# Patient Record
Sex: Male | Born: 1985 | Race: White | Hispanic: No | Marital: Married | Smoking: Never smoker
Health system: Southern US, Community
[De-identification: ages and names within clinical notes are randomized; demographics above are authoritative.]

---
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
## 2017-09-25 ENCOUNTER — Encounter (HOSPITAL_COMMUNITY): Payer: Self-pay | Admitting: Emergency Medicine

## 2017-09-25 ENCOUNTER — Other Ambulatory Visit: Payer: Self-pay

## 2017-09-25 ENCOUNTER — Emergency Department (HOSPITAL_COMMUNITY)
Admission: EM | Admit: 2017-09-25 | Discharge: 2017-09-25 | Disposition: A | Discharge status: Home / Self Care | Payer: Commercial Managed Care - PPO | Attending: Emergency Medicine | Admitting: Emergency Medicine

## 2017-09-25 DIAGNOSIS — M79601 Pain in right arm: Secondary | ICD-10-CM | POA: Diagnosis present

## 2017-09-25 DIAGNOSIS — R2231 Localized swelling, mass and lump, right upper limb: Secondary | ICD-10-CM | POA: Insufficient documentation

## 2017-09-25 DIAGNOSIS — M7989 Other specified soft tissue disorders: Secondary | ICD-10-CM

## 2017-09-25 NOTE — ED Notes (Signed)
Pt verbalized understanding of d/c instructions, vss, ambulatory out of ed upon discharge. nad

## 2017-09-25 NOTE — Discharge Instructions (Signed)
You were seen in the ER for right arm swelling.   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  This is usually due to chronic, repeated microtrauma and compression of the ulnar nerve that runs from your elbow to your ring and pinky fingers.  Occasionally, strenuous activity or sports (ex: golf or baseball) can worsen symptoms.   Initial management includes anti-inflammatories, ice, rest and avoiding repeated trauma.  Take tylenol [phone] mg every 8 hours for the next 3 days.  Additionally, ice your elbow and wrist for 20-30 mins 2 to 3 times a day for the next 3 days.  Avoid repeated trauma or movements of your elbow and wrist. You may purchase an elbow pad to wear during computer time.   Follow up with your primary care doctor in 1 week if symptoms do not improve. Severe neuropathy sometimes need nerve and muscle testing, MRI, surgery.

## 2017-09-25 NOTE — ED Notes (Signed)
PA at bedside.

## 2017-09-25 NOTE — ED Provider Notes (Signed)
[facility] EMERGENCY DEPARTMENT Provider Note   CSN: [id number] Arrival date & time: 09/25/17  0945     History   Chief Complaint Chief Complaint  Patient presents with  . Arm Pain    HPI [name] is a 32 y.o. male here for evaluation of right arm swelling. Noticed today after he was moving some boxes.  Associated with mild pain described as "sorenes" to the ulnar aspect of his wrist and medial elbow.  Due to swelling initially thought it was an allergic reaction, he thought he may have come across or touched insulation so he took benadryl which did not help. He denies any redness, warmth or itching. He is RHD. He sits at and uses a computer for long periods of time at work. No weakness or numbness.   HPI  History reviewed. No pertinent past medical history.  There are no active problems to display for this patient.   History reviewed. No pertinent surgical history.      Home Medications    Prior to Admission medications   Not on File    Family History No family history on file.  Social History Social History   Tobacco Use  . Smoking status: Not on file  Substance Use Topics  . Alcohol use: Not on file  . Drug use: Not on file     Allergies   Patient has no known allergies.   Review of Systems Review of Systems  Musculoskeletal: Positive for gait problem.  All other systems reviewed and are negative.    Physical Exam Updated Vital Signs BP 112/73 (BP Location: Left Arm)   Pulse 86   Temp 98 F (36.7 C) (Oral)   Resp 20   SpO2 98%   Physical Exam  Constitutional: He is oriented to person, place, and time. He appears well-developed and well-nourished.  Non-toxic appearance.  HENT:  Head: Normocephalic.  Right Ear: External ear normal.  Left Ear: External ear normal.  Nose: Nose normal.  Eyes: Conjunctivae and EOM are normal.  Neck: Full passive range of motion without pain.  No midline or paraspinal muscle  tenderness to c-spine. Full AROM of neck without pain.   Cardiovascular: Normal rate.  2+ radial and ulnar pulses bilaterally  Pulmonary/Chest: Effort normal. No tachypnea. No respiratory distress.  Musculoskeletal: Normal range of motion. He exhibits tenderness.  Mild edema to medial aspect of olecranon, medial forearm and ulnar aspect of wrist and dorsal hand. Mild tenderness to medial aspect of olecranon and more significant tenderness along distribution of ulnar nerve towards hand and ulnar aspect of wrist. Full AROM of wrist with no pain. Full thumb opposition to all fingers with good strength. 5/5 hand grip bilaterally. 5/5 strength with finger flexion, extension, abduction and adduction. 5/5 strength with elbow F/E, painless.   Neurological: He is alert and oriented to person, place, and time.  Slight decreased sensation to light touch/pinch to right pinky finger, otherwise sensation to light touch/pinch intact bilaterally.    Skin: Skin is warm and dry. Capillary refill takes less than 2 seconds.  Psychiatric: His behavior is normal. Thought content normal.     ED Treatments / Results  Labs (all labs ordered are listed, but only abnormal results are displayed) Labs Reviewed - No data to display  EKG None  Radiology No results found.  Procedures Procedures (including critical care time)  Medications Ordered in ED Medications - No data to display   Initial Impression / Assessment and Plan /  ED Course  I have reviewed the triage vital signs and the nursing notes.  Pertinent labs & imaging results that were available during my care of the patient were reviewed by me and considered in my medical decision making (see chart for details).    ddx includes ulnar neuropathy vs other MSK or neuropathy. He has swelling and tenderness along ulnar nerve distribution, more focal tenderness at ulnar aspect of wrist. He does sit and type on his computer for work. 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 There was pt concern about allergic reaction given swelling, however he has no rash, erythema, pruritis and this is less likely.  Other than subjective mild decreased sensation to pinky finger, no signs of persistent or significant sensory loss, muscle wasting, weakness. He has no neck or thoracic midline pain to suggest more proximal lesion or bulging disc. Will discharge with RICE, rest, recommendation to wear elbow pad and modify activities. F/u with PCP for re-evaluation if symptoms persist for possible nerve/muscle testing. Return precautions given.   Final Clinical Impressions(s) / ED Diagnoses   Final diagnoses:  Arm swelling    ED Discharge Orders    None       Liberty HandyGibbons, Briget Shaheed J, PA-C 09/25/17 1049    Gwyneth SproutPlunkett, Whitney, MD 09/26/17 2302

## 2017-09-25 NOTE — ED Triage Notes (Signed)
Pt reports he was moving some boxes this am when he started to have pain and swelling to right forearm. Denies numbness or tingling, red and warm to the touch

## 2017-09-25 NOTE — ED Notes (Signed)
Ice pack placed to forearm

## 2017-11-16 ENCOUNTER — Other Ambulatory Visit: Payer: Self-pay | Admitting: Nurse Practitioner

## 2017-11-16 DIAGNOSIS — R1031 Right lower quadrant pain: Secondary | ICD-10-CM

## 2017-11-22 ENCOUNTER — Other Ambulatory Visit: Payer: Commercial Managed Care - PPO

## 2017-11-28 ENCOUNTER — Ambulatory Visit
Admission: RE | Admit: 2017-11-28 | Discharge: 2017-11-28 | Disposition: A | Discharge status: Home / Self Care | Payer: Commercial Managed Care - PPO | Source: Ambulatory Visit | Attending: Nurse Practitioner | Admitting: Nurse Practitioner

## 2017-11-28 DIAGNOSIS — R1031 Right lower quadrant pain: Secondary | ICD-10-CM

## 2018-12-12 ENCOUNTER — Other Ambulatory Visit: Payer: Self-pay | Admitting: Emergency Medicine

## 2018-12-12 DIAGNOSIS — Z20822 Contact with and (suspected) exposure to covid-19: Secondary | ICD-10-CM

## 2018-12-13 LAB — NOVEL CORONAVIRUS, NAA: SARS-CoV-2, NAA: DETECTED — AB

## 2018-12-29 ENCOUNTER — Other Ambulatory Visit: Payer: Self-pay | Admitting: *Deleted

## 2018-12-29 DIAGNOSIS — Z20822 Contact with and (suspected) exposure to covid-19: Secondary | ICD-10-CM

## 2018-12-30 LAB — NOVEL CORONAVIRUS, NAA: SARS-CoV-2, NAA: DETECTED — AB

## 2019-03-24 IMAGING — US US PELVIS LIMITED
1 series · 14 of 18 positions shown · non-contrast
Comparison: None.

CLINICAL DATA: Painful palpable area in the right groin

EXAM:
LIMITED ULTRASOUND OF PELVIS
TECHNIQUE: Limited transabdominal ultrasound examination of the pelvis was
performed.

[Series 1: us pelvis limited · 0.08mm/px · 18 acquisitions, 14 frames shown]
[im 1/18]
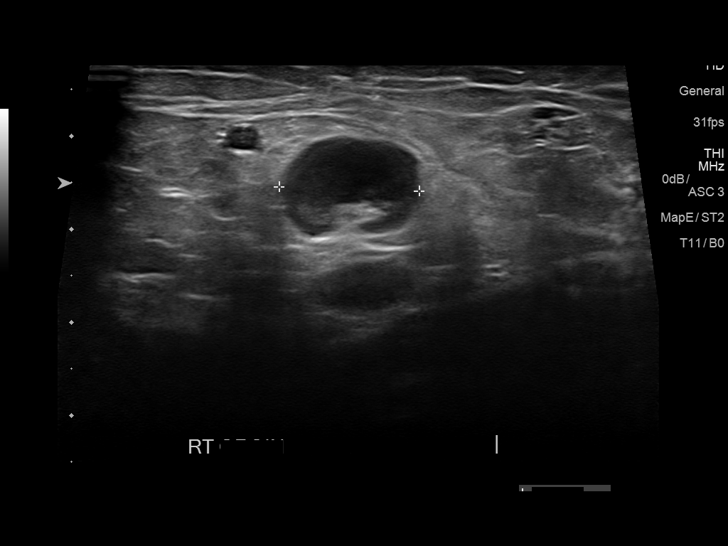
[im 2/18]
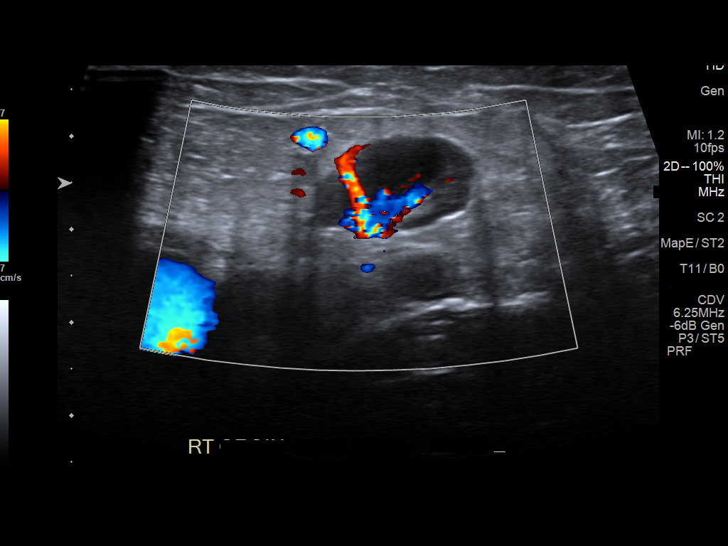
[im 4/18]
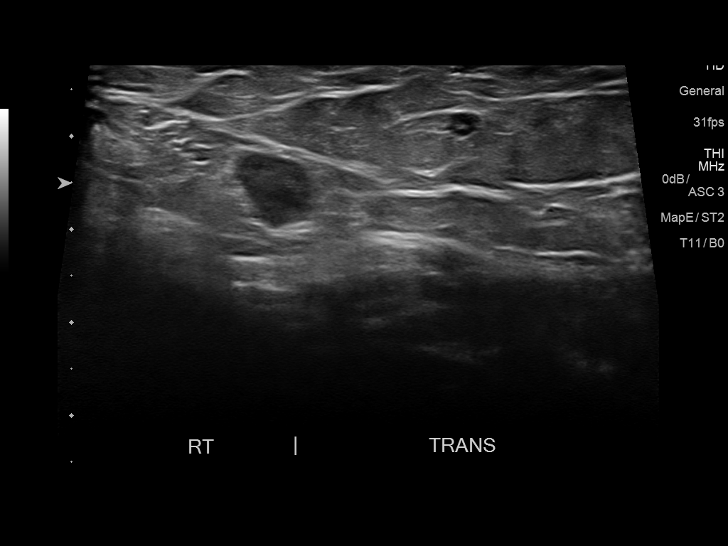
[im 5/18]
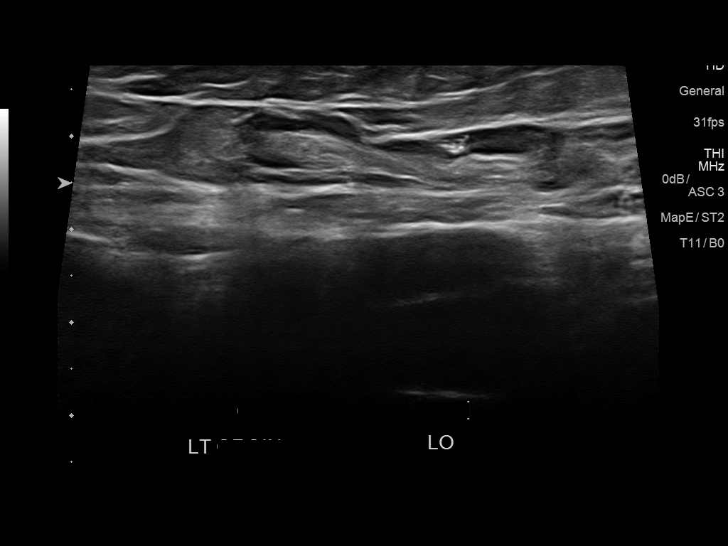
[im 6/18]
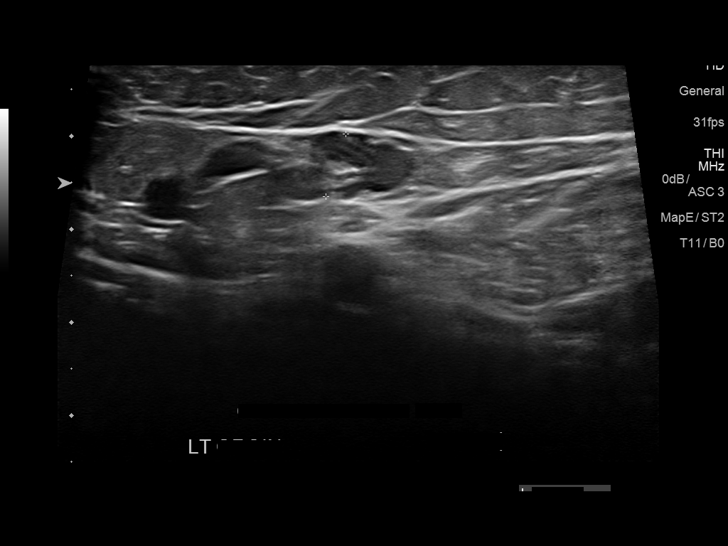
[im 8/18]
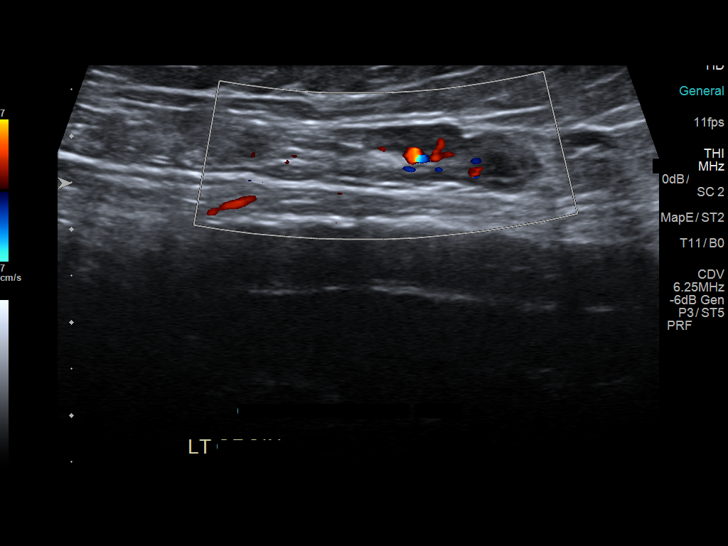
[im 9/18]
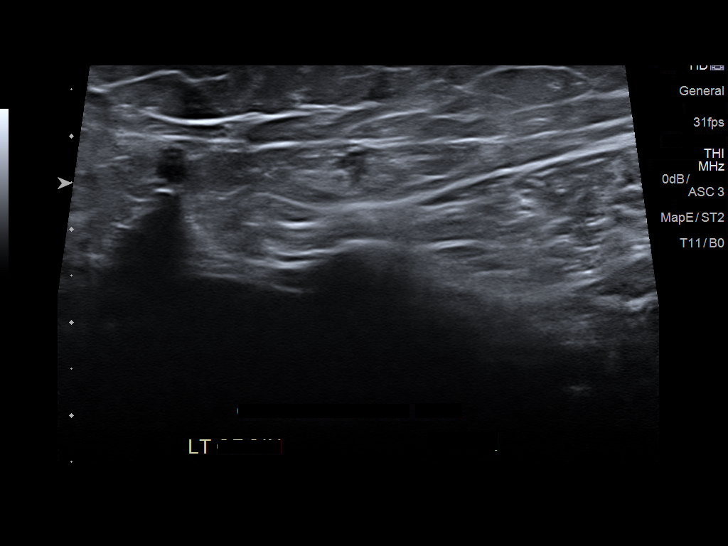
[im 10/18]
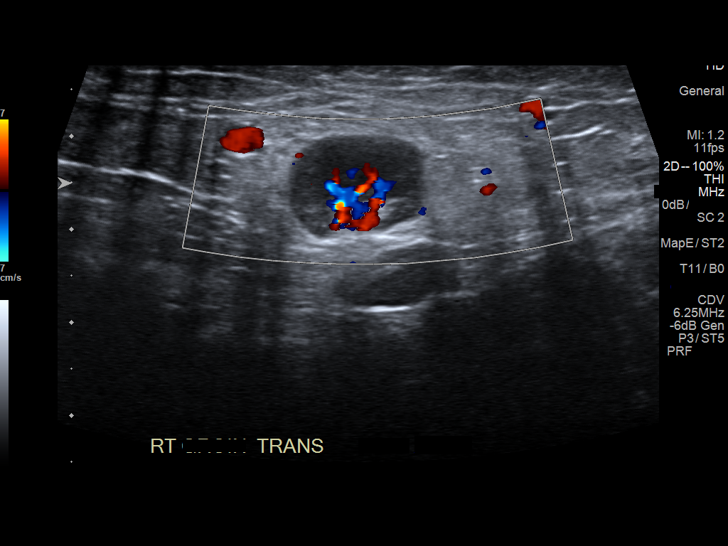
[im 11/18]
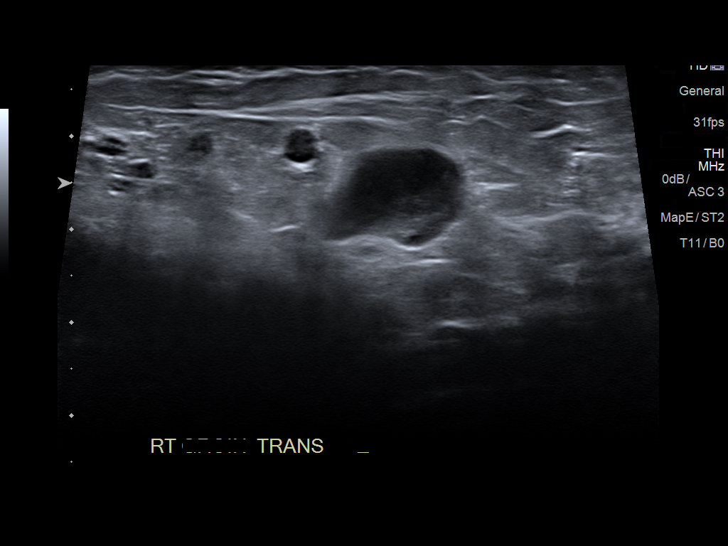
[im 13/18]
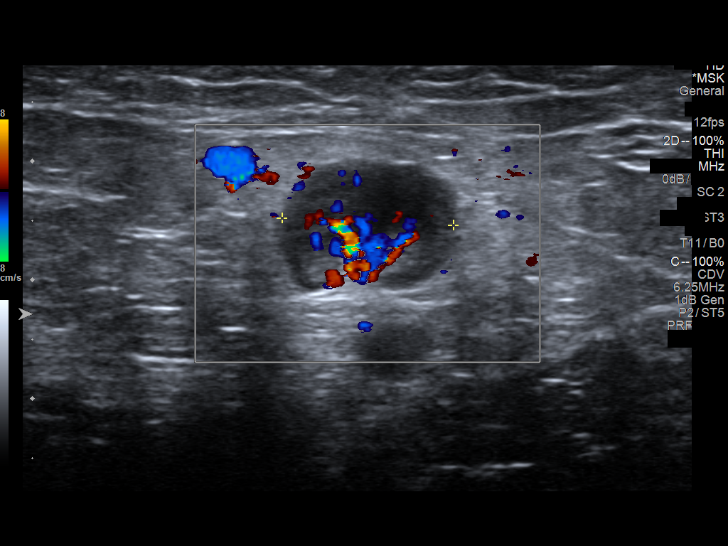
[im 14/18]
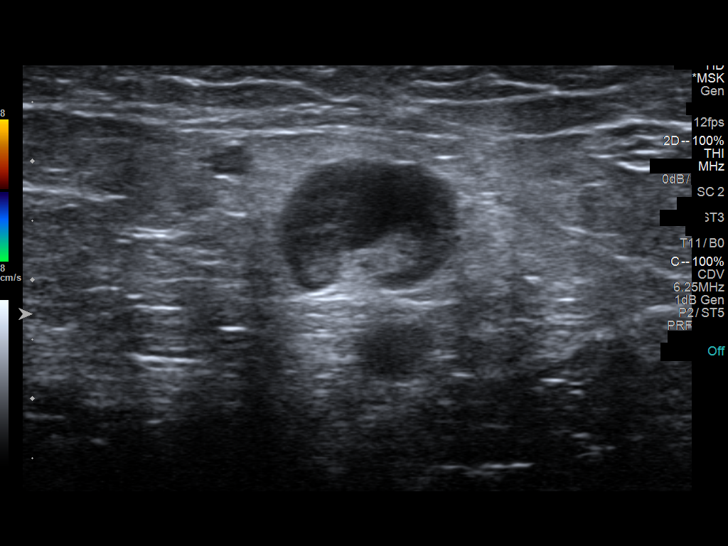
[im 15/18]
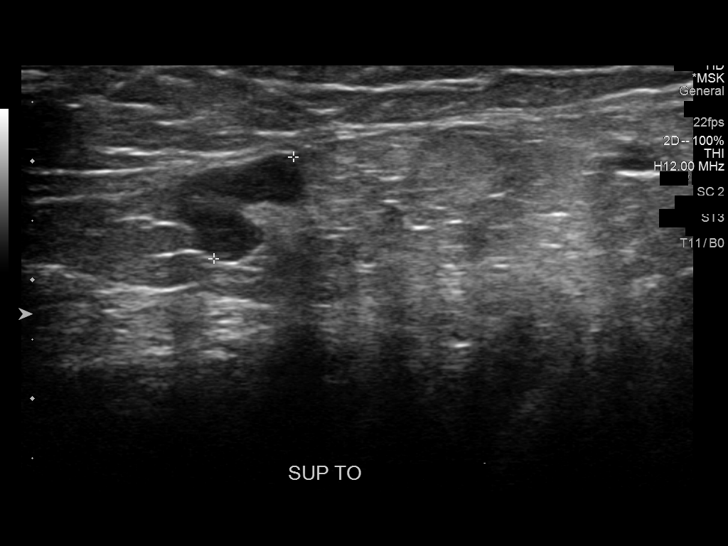
[im 17/18]
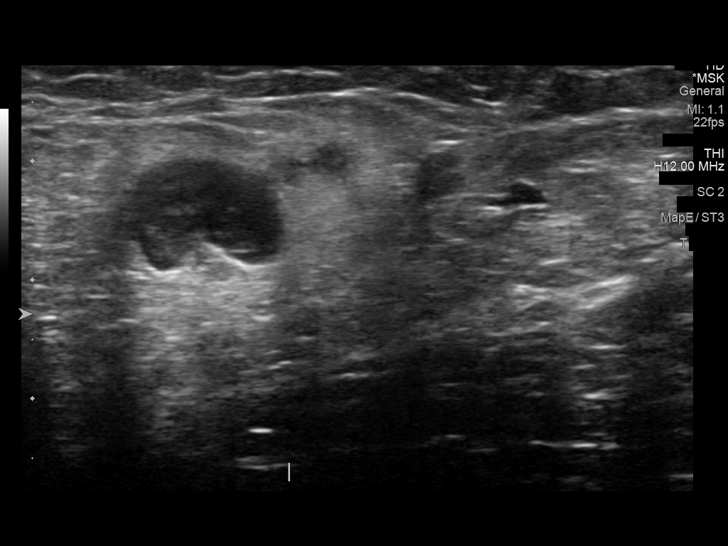
[im 18/18]
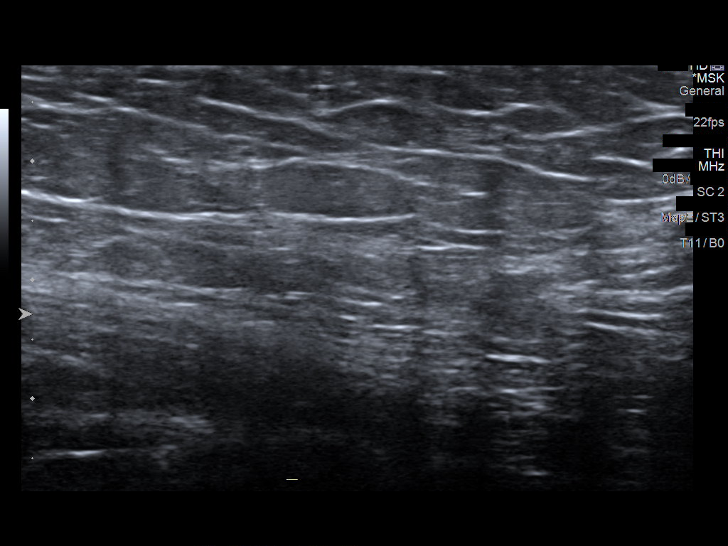

[14 of 18 positions shown; findings below may reference images not displayed]

FINDINGS: Targeted sonography of the right groin is performed in the region of
palpable mass. In the region of palpable abnormality and tenderness
is an enlarged lymph nodes measuring 1.4 cm in maximum diameter.
Cortex is thickened. Echogenic hilus is maintained. There are
additional nonspecific bilateral groin lymph nodes. No focal fluid
collections.
IMPRESSION: Corresponding to the patient's tender nodule in the right groin is
an enlarged lymph node measuring 1.4 cm with thickened cortex.
Further management will need to be based on clinical grounds.

## 2019-12-09 ENCOUNTER — Emergency Department (HOSPITAL_COMMUNITY)
Admission: EM | Admit: 2019-12-09 | Discharge: 2019-12-10 | Disposition: A | Discharge status: Home / Self Care | Payer: 59 | Attending: Emergency Medicine | Admitting: Emergency Medicine

## 2019-12-09 ENCOUNTER — Other Ambulatory Visit: Payer: Self-pay

## 2019-12-09 ENCOUNTER — Encounter (HOSPITAL_COMMUNITY): Payer: Self-pay | Admitting: Emergency Medicine

## 2019-12-09 DIAGNOSIS — R21 Rash and other nonspecific skin eruption: Secondary | ICD-10-CM | POA: Insufficient documentation

## 2019-12-09 DIAGNOSIS — R001 Bradycardia, unspecified: Secondary | ICD-10-CM | POA: Diagnosis not present

## 2019-12-09 DIAGNOSIS — R220 Localized swelling, mass and lump, head: Secondary | ICD-10-CM | POA: Diagnosis not present

## 2019-12-09 DIAGNOSIS — T7840XA Allergy, unspecified, initial encounter: Secondary | ICD-10-CM | POA: Diagnosis not present

## 2019-12-09 DIAGNOSIS — L509 Urticaria, unspecified: Secondary | ICD-10-CM | POA: Diagnosis present

## 2019-12-09 MED ORDER — FAMOTIDINE IN NACL 20-0.9 MG/50ML-% IV SOLN
20.0000 mg | INTRAVENOUS | Status: AC
  Administered 2019-12-09: 20 mg via INTRAVENOUS
  Filled 2019-12-09: qty 50

## 2019-12-09 MED ORDER — ATROPINE SULFATE 1 MG/10ML IJ SOSY
PREFILLED_SYRINGE | INTRAMUSCULAR | Status: AC
  Filled 2019-12-09: qty 10

## 2019-12-09 MED ORDER — EPINEPHRINE 1 MG/10ML IJ SOSY
PREFILLED_SYRINGE | INTRAMUSCULAR | Status: DC
Start: 2019-12-09 — End: 2019-12-10
  Filled 2019-12-09: qty 10

## 2019-12-09 MED ORDER — EPINEPHRINE 0.3 MG/0.3ML IJ SOAJ
INTRAMUSCULAR | Status: AC
  Filled 2019-12-09: qty 0.3

## 2019-12-09 MED ORDER — METHYLPREDNISOLONE SODIUM SUCC 125 MG IJ SOLR
125.0000 mg | Freq: Once | INTRAMUSCULAR | Status: AC
  Administered 2019-12-09: 125 mg via INTRAVENOUS
  Filled 2019-12-09: qty 2

## 2019-12-09 MED ORDER — ONDANSETRON HCL 4 MG/2ML IJ SOLN
4.0000 mg | Freq: Once | INTRAMUSCULAR | Status: AC
  Administered 2019-12-09: 4 mg via INTRAVENOUS
  Filled 2019-12-09: qty 2

## 2019-12-09 NOTE — ED Provider Notes (Addendum)
[facility] EMERGENCY DEPARTMENT Provider Note   CSN: [id number] Arrival date & time: 12/09/19  2036     History Chief Complaint  Patient presents with  . Allergic Reaction    Seth Cameron is a 34 y.o. male.  Patient presents to the emergency department with a chief complaint of hives.  He states that he was stung by something while taking out the trash can tonight.  He states that shortly thereafter, he began breaking out in hives.  He reports some swelling of his lip.  He denies shortness of breath, nausea, or vomiting.  States he took 2 Benadryl, but has not had much relief.  He denies any history of anaphylaxis, but states that he has had hives in the past.  The history is provided by the patient. No language interpreter was used.       History reviewed. No pertinent past medical history.  There are no problems to display for this patient.   History reviewed. No pertinent surgical history.     No family history on file.  Social History   Tobacco Use  . Smoking status: Never Smoker  . Smokeless tobacco: Never Used  Substance Use Topics  . Alcohol use: Yes  . Drug use: Yes    Types: Marijuana    Home Medications Prior to Admission medications   Not on File    Allergies    Patient has no known allergies.  Review of Systems   Review of Systems  All other systems reviewed and are negative.   Physical Exam Updated Vital Signs BP 104/63 (BP Location: Right Arm)   Pulse 65   Temp 97.6 F (36.4 C) (Oral)   Resp 16   Ht 5\' 10"  (1.778 m)   Wt 86.2 kg   SpO2 100%   BMI 27.26 kg/m   Physical Exam Vitals and nursing note reviewed.  Constitutional:      Appearance: He is well-developed.  HENT:     Head: Normocephalic and atraumatic.     Mouth/Throat:     Comments: Mild lower left lip swelling No oropharyngeal swelling Normal phonation No stridor Eyes:     Conjunctiva/sclera: Conjunctivae normal.  Cardiovascular:     Rate  and Rhythm: Normal rate and regular rhythm.     Heart sounds: No murmur heard.   Pulmonary:     Effort: Pulmonary effort is normal. No respiratory distress.     Breath sounds: Normal breath sounds. No wheezing.     Comments: Lung sounds are clear Abdominal:     Palpations: Abdomen is soft.     Tenderness: There is no abdominal tenderness.  Musculoskeletal:        General: Normal range of motion.     Cervical back: Neck supple.  Skin:    General: Skin is warm and dry.     Findings: Rash present.     Comments: Diffuse hives on torso and upper extremities  Neurological:     Mental Status: He is alert and oriented to person, place, and time.  Psychiatric:        Mood and Affect: Mood normal.        Behavior: Behavior normal.     ED Results / Procedures / Treatments   Labs (all labs ordered are listed, but only abnormal results are displayed) Labs Reviewed - No data to display  EKG None  Radiology No results found.  Procedures .Critical Care Performed by: , PA-C Authorized by: Roxy Horseman,  PA-C   Critical care provider statement:    Critical care time (minutes):  45   Critical care was necessary to treat or prevent imminent or life-threatening deterioration of the following conditions:  Circulatory failure   Critical care was time spent personally by me on the following activities:  Discussions with consultants, evaluation of patient's response to treatment, examination of patient, ordering and performing treatments and interventions, ordering and review of laboratory studies, ordering and review of radiographic studies, pulse oximetry, re-evaluation of patient's condition, obtaining history from patient or surrogate and review of old charts   (including critical care time)  Medications Ordered in ED Medications  methylPREDNISolone sodium succinate (SOLU-MEDROL) 125 mg/2 mL injection 125 mg (has no administration in time range)  famotidine (PEPCID)  IVPB 20 mg premix (has no administration in time range)    ED Course  I have reviewed the triage vital signs and the nursing notes.  Pertinent labs & imaging results that were available during my care of the patient were reviewed by me and considered in my medical decision making (see chart for details).    MDM Rules/Calculators/A&P                          This patient complains of hives, this involves an extensive number of treatment options, and is a complaint that carries with it a high risk of complications and morbidity.    Differential Dx Insect sting or bite, allergic reaction, hives, anaphylaxis.  Medications I ordered medication solumedrol and pepcid for allergic reaction.   Critical Interventions  ~2345 I was called to the bedside after the patient brady'd down to the low 30s with BP in the 70s systolic with brief ~2 sec syncopal episode.  Initially thought to be vasovagal, because the onset was about when PIV was placed.  Dr. Pilar Plate was also called to the bedside.  Patient was given 0.5 mg of atropine due to symptomatic bradycardia.  Blood pressure and heart rate shortly thereafter began to improve.  Given that blood pressures remained above 90, EpiPen was not used, but was at the bedside and available if needed.  Reassessments/Plan After the interventions stated above, I reevaluated at 2:00 AM, patient states that he is feeling well.  He would like to be discharged.  He states that he feels much better than he did before.  Given the patient's variability in heart rate, I have recommended that he follow-up with a cardiologist.  I discussed this plan with Dr. Nicanor Alcon, who also agrees with this recommendation.  Will discharge patient home with a prescription for prednisone and EpiPen.   2:22 AM HR dropped to the 40s when IV removed, but no change in patient condition.  HR jumped back up the 50s-60s.  Ambulates without difficulty.  Feels well.    Final Clinical Impression(s)  / ED Diagnoses Final diagnoses:  Allergic reaction, initial encounter  Bradycardia    Rx / DC Orders ED Discharge Orders    None       Roxy Horseman, PA-C 12/10/19 0201    Roxy Horseman, PA-C 12/10/19 2993    Sabas Sous, MD 12/10/19 [phone]

## 2019-12-09 NOTE — ED Triage Notes (Signed)
Pt st's he and his wife were taking a walk and something stung him on left shoulder.  Shortly afterwards pt started breathing out in a rash all over with itching.  Pt took Benadryl 50mg  before coming to ED   No resp distress noted.

## 2019-12-09 NOTE — ED Notes (Signed)
RN was attempting to start an IV in pt's hand, he became diaphoretic stating he did not feel good.  Pt became nauseas, provider was notified and ordered zofran IV.  Pt's heart rate started to decrease and his blood pressure dropped.  Provider bedside.  Another IV was started in right [facility].  Heart rate decreased to 30, given atropine .5, heart rate slowly increased.

## 2019-12-10 MED ORDER — EPINEPHRINE 0.3 MG/0.3ML IJ SOAJ: 0.3000 mg | Freq: Once | INTRAMUSCULAR | 1 refills | Status: AC | PRN

## 2019-12-10 MED ORDER — PREDNISONE 20 MG PO TABS: 40.0000 mg | ORAL_TABLET | Freq: Every day | ORAL | 0 refills | Status: DC

## 2019-12-10 MED ORDER — SODIUM CHLORIDE 0.9 % IV BOLUS
1000.0000 mL | Freq: Once | INTRAVENOUS | Status: AC
  Administered 2019-12-10: 1000 mL via INTRAVENOUS

## 2019-12-10 NOTE — ED Notes (Signed)
Pt ambulated in hall, HR at 79

## 2019-12-10 NOTE — ED Notes (Signed)
Pt ambulated fine with no assistance and had no complaints.

## 2019-12-10 NOTE — Discharge Instructions (Signed)
I would like for your to follow-up with a cardiologist because of the variability in your heart rate.  Please contact the group listed.  If your symptoms change or worsen, return to the ER.  Use the epi-pen for shortness of breath or throat tightening sensation.

## 2019-12-10 NOTE — ED Notes (Signed)
Pt reports he is feeling much better, ready to go home. Tec to do orthostatic vitals and walk pt.

## 2019-12-10 NOTE — ED Notes (Signed)
RN was taking the IV out to discharge pt and his heart rate dropped to 40bpm, eventually going back up to 60.  Provider's aware and bedside

## 2022-07-03 ENCOUNTER — Telehealth: Payer: 59 | Admitting: Nurse Practitioner

## 2022-07-03 DIAGNOSIS — B9689 Other specified bacterial agents as the cause of diseases classified elsewhere: Secondary | ICD-10-CM

## 2022-07-03 DIAGNOSIS — J019 Acute sinusitis, unspecified: Secondary | ICD-10-CM

## 2022-07-03 MED ORDER — AMOXICILLIN-POT CLAVULANATE 875-125 MG PO TABS: 1.0000 | ORAL_TABLET | Freq: Two times a day (BID) | ORAL | 0 refills | Status: AC

## 2022-07-03 MED ORDER — AZELASTINE HCL 0.1 % NA SOLN: 2.0000 | Freq: Two times a day (BID) | NASAL | 0 refills | Status: DC

## 2022-07-03 NOTE — Progress Notes (Signed)
Virtual Visit Consent   Seth Cameron, you are scheduled for a virtual visit with a [facility] provider today. Just as with appointments in the office, your consent must be obtained to participate. Your consent will be active for this visit and any virtual visit you may have with one of our providers in the next 365 days. If you have a MyChart account, a copy of this consent can be sent to you electronically.  As this is a virtual visit, video technology does not allow for your provider to perform a traditional examination. This may limit your provider's ability to fully assess your condition. If your provider identifies any concerns that need to be evaluated in person or the need to arrange testing (such as labs, EKG, etc.), we will make arrangements to do so. Although advances in technology are sophisticated, we cannot ensure that it will always work on either your end or our end. If the connection with a video visit is poor, the visit may have to be switched to a telephone visit. With either a video or telephone visit, we are not always able to ensure that we have a secure connection.  By engaging in this virtual visit, you consent to the provision of healthcare and authorize for your insurance to be billed (if applicable) for the services provided during this visit. Depending on your insurance coverage, you may receive a charge related to this service.  I need to obtain your verbal consent now. Are you willing to proceed with your visit today? Seth Cameron has provided verbal consent on 07/03/2022 for a virtual visit (video or telephone). Gildardo Pounds, NP  Date: 07/03/2022 10:42 AM  Virtual Visit via Video Note   I, Gildardo Pounds, connected with  Seth Cameron  ([id number], 1985-08-11) on 07/03/22 at 10:30 AM EDT by a video-enabled telemedicine application and verified that I am speaking with the correct person using two identifiers.  Location: Patient: Virtual Visit Location  Patient: Home Provider: Virtual Visit Location Provider: Home Office   I discussed the limitations of evaluation and management by telemedicine and the availability of in person appointments. The patient expressed understanding and agreed to proceed.    History of Present Illness: Seth Cameron is a 37 y.o. who identifies as a male who was assigned male at birth, and is being seen today for sinus problems.   Seth Cameron behing eyes and middle of forehead. Been under the weaithe rall week. Stufiness. Nasal phlegm. Worsening. Purueln nasla driange. Closing eyes is painful.  Seth Cameron endorses the following symptoms worsening over the past week: Symptoms include congestion, facial pain, headache described as pressure, nasal congestion, purulent nasal discharge, and sinus pressure with no fever, chills, night sweats or weight loss. O He is not a smoker. No history of asthma, allergies or vaping.   Problems: There are no problems to display for this patient.   Allergies: No Known Allergies Medications:  Current Outpatient Medications:    amoxicillin-clavulanate (AUGMENTIN) 875-125 MG tablet, Take 1 tablet by mouth 2 (two) times daily for 7 days., Disp: 14 tablet, Rfl: 0   azelastine (ASTELIN) 0.1 % nasal spray, Place 2 sprays into both nostrils 2 (two) times daily. Use in each nostril as directed, Disp: 30 mL, Rfl: 0   EPINEPHrine (EPIPEN 2-PAK) 0.3 mg/0.3 mL IJ SOAJ injection, Inject 0.3 mLs (0.3 mg total) into the muscle once as needed for up to 1 dose (for severe allergic reaction). CAll 911 immediately if  you have to use this medicine, Disp: 1 each, Rfl: 1  Observations/Objective: Patient is well-developed, well-nourished in no acute distress.  Resting comfortably  at home.  Head is normocephalic, atraumatic.  No labored breathing.  Speech is clear and coherent with logical content.  Patient is alert and oriented at baseline.    Assessment and Plan: 1. Acute bacterial sinusitis -  azelastine (ASTELIN) 0.1 % nasal spray; Place 2 sprays into both nostrils 2 (two) times daily. Use in each nostril as directed  Dispense: 30 mL; Refill: 0 - amoxicillin-clavulanate (AUGMENTIN) 875-125 MG tablet; Take 1 tablet by mouth 2 (two) times daily for 7 days.  Dispense: 14 tablet; Refill: 0    Follow Up Instructions: I discussed the assessment and treatment plan with the patient. The patient was provided an opportunity to ask questions and all were answered. The patient agreed with the plan and demonstrated an understanding of the instructions.  A copy of instructions were sent to the patient via MyChart unless otherwise noted below.    The patient was advised to call back or seek an in-person evaluation if the symptoms worsen or if the condition fails to improve as anticipated.  Time:  I spent 12 minutes with the patient via telehealth technology discussing the above problems/concerns.    Gildardo Pounds, NP

## 2022-07-03 NOTE — Patient Instructions (Signed)
  Leda Gauze, thank you for joining Gildardo Pounds, NP for today's virtual visit.  While this provider is not your primary care provider (PCP), if your PCP is located in our provider database this encounter information will be shared with them immediately following your visit.   [facility] account gives you access to today's visit and all your visits, tests, and labs performed at [facility] " click here if you don't have a [facility] account or go to [email or web address]  Consent: (Patient) Leda Gauze provided verbal consent for this virtual visit at the beginning of the encounter.  Current Medications:  Current Outpatient Medications:    amoxicillin-clavulanate (AUGMENTIN) 875-125 MG tablet, Take 1 tablet by mouth 2 (two) times daily for 7 days., Disp: 14 tablet, Rfl: 0   azelastine (ASTELIN) 0.1 % nasal spray, Place 2 sprays into both nostrils 2 (two) times daily. Use in each nostril as directed, Disp: 30 mL, Rfl: 0   EPINEPHrine (EPIPEN 2-PAK) 0.3 mg/0.3 mL IJ SOAJ injection, Inject 0.3 mLs (0.3 mg total) into the muscle once as needed for up to 1 dose (for severe allergic reaction). CAll 911 immediately if you have to use this medicine, Disp: 1 each, Rfl: 1   Medications ordered in this encounter:  Meds ordered this encounter  Medications   azelastine (ASTELIN) 0.1 % nasal spray    Sig: Place 2 sprays into both nostrils 2 (two) times daily. Use in each nostril as directed    Dispense:  30 mL    Refill:  0    Order Specific Question:   Supervising Provider    Answer:   Chase Picket [id number]   amoxicillin-clavulanate (AUGMENTIN) 875-125 MG tablet    Sig: Take 1 tablet by mouth 2 (two) times daily for 7 days.    Dispense:  14 tablet    Refill:  0    Order Specific Question:   Supervising Provider    Answer:   Chase Picket [id number]     *If you need refills on other medications prior to your next appointment, please contact  your pharmacy*  Follow-Up: Call back or seek an in-person evaluation if the symptoms worsen or if the condition fails to improve as anticipated.  [place] [facility] [phone]   If you have been instructed to have an in-person evaluation today at a local Urgent Care facility, please use the link below. It will take you to a list of all of our available [facility], including address, phone number and hours of operation. Please do not delay care.  [facility]  If you or a family member do not have a primary care provider, use the link below to schedule a visit and establish care. When you choose a [facility] primary care physician or advanced practice provider, you gain a long-term partner in health. Find a Primary Care Provider  Learn more about [facility] in-office and virtual care options: [facility]

## 2022-07-25 ENCOUNTER — Other Ambulatory Visit: Payer: Self-pay | Admitting: Nurse Practitioner

## 2022-07-25 DIAGNOSIS — B9689 Other specified bacterial agents as the cause of diseases classified elsewhere: Secondary | ICD-10-CM
